# Patient Record
Sex: Female | Born: 1980 | Hispanic: Yes | Marital: Single
Health system: Southern US, Community
[De-identification: ages and names within clinical notes are randomized; demographics above are authoritative.]

---

## 2006-03-08 ENCOUNTER — Ambulatory Visit: Payer: Self-pay | Admitting: Family Medicine

## 2008-04-24 ENCOUNTER — Ambulatory Visit: Payer: Self-pay

## 2012-03-29 ENCOUNTER — Ambulatory Visit: Payer: Self-pay | Admitting: Family Medicine

## 2012-06-27 ENCOUNTER — Ambulatory Visit: Payer: Self-pay | Admitting: Family Medicine

## 2012-08-29 ENCOUNTER — Observation Stay: Payer: Self-pay | Admitting: Obstetrics and Gynecology

## 2012-08-29 LAB — RUPTURE OF MEMBRANE PLUS: Rom Plus: NOT DETECTED

## 2012-09-12 ENCOUNTER — Observation Stay: Payer: Self-pay | Admitting: Obstetrics and Gynecology

## 2012-09-12 LAB — URINALYSIS, COMPLETE
Glucose,UR: NEGATIVE mg/dL (ref 0–75)
Ketone: NEGATIVE
Ph: 8 (ref 4.5–8.0)
RBC,UR: 42 /HPF (ref 0–5)
Specific Gravity: 1.008 (ref 1.003–1.030)
Squamous Epithelial: 1

## 2012-11-15 ENCOUNTER — Inpatient Hospital Stay: Payer: Self-pay | Admitting: Obstetrics and Gynecology

## 2012-11-15 LAB — CBC WITH DIFFERENTIAL/PLATELET
Basophil #: 0 10*3/uL (ref 0.0–0.1)
Eosinophil #: 0 10*3/uL (ref 0.0–0.7)
Eosinophil %: 0.4 %
HGB: 10.2 g/dL — ABNORMAL LOW (ref 12.0–16.0)
Lymphocyte %: 23.6 %
MCH: 26.8 pg (ref 26.0–34.0)
MCV: 80 fL (ref 80–100)
Monocyte %: 7.4 %
Neutrophil #: 6.7 10*3/uL — ABNORMAL HIGH (ref 1.4–6.5)
Neutrophil %: 68.1 %
Platelet: 207 10*3/uL (ref 150–440)
RDW: 15.7 % — ABNORMAL HIGH (ref 11.5–14.5)
WBC: 9.8 10*3/uL (ref 3.6–11.0)

## 2012-11-16 LAB — HEMOGLOBIN: HGB: 8.1 g/dL — ABNORMAL LOW (ref 12.0–16.0)

## 2013-02-05 ENCOUNTER — Emergency Department: Payer: Self-pay | Admitting: Emergency Medicine

## 2013-02-05 LAB — URINALYSIS, COMPLETE
Blood: NEGATIVE
Glucose,UR: NEGATIVE mg/dL (ref 0–75)
Ketone: NEGATIVE
Ph: 6 (ref 4.5–8.0)
RBC,UR: 3 /HPF (ref 0–5)
Specific Gravity: 1.017 (ref 1.003–1.030)

## 2013-02-05 LAB — COMPREHENSIVE METABOLIC PANEL
Albumin: 4 g/dL (ref 3.4–5.0)
Alkaline Phosphatase: 70 U/L (ref 50–136)
BUN: 19 mg/dL — ABNORMAL HIGH (ref 7–18)
Calcium, Total: 9 mg/dL (ref 8.5–10.1)
Creatinine: 0.92 mg/dL (ref 0.60–1.30)
Osmolality: 278 (ref 275–301)
Potassium: 4 mmol/L (ref 3.5–5.1)
SGOT(AST): 42 U/L — ABNORMAL HIGH (ref 15–37)
SGPT (ALT): 79 U/L — ABNORMAL HIGH (ref 12–78)
Sodium: 138 mmol/L (ref 136–145)
Total Protein: 7.8 g/dL (ref 6.4–8.2)

## 2013-02-05 LAB — CBC
HGB: 11.4 g/dL — ABNORMAL LOW (ref 12.0–16.0)
MCHC: 33.3 g/dL (ref 32.0–36.0)
MCV: 82 fL (ref 80–100)
RBC: 4.17 10*6/uL (ref 3.80–5.20)

## 2013-02-05 LAB — PROTIME-INR: Prothrombin Time: 13.6 secs (ref 11.5–14.7)

## 2014-02-07 ENCOUNTER — Ambulatory Visit: Payer: Self-pay | Admitting: Physician Assistant

## 2014-06-19 ENCOUNTER — Inpatient Hospital Stay: Payer: Self-pay | Admitting: Obstetrics and Gynecology

## 2014-07-08 ENCOUNTER — Emergency Department: Payer: Self-pay | Admitting: Emergency Medicine

## 2014-07-20 LAB — URINE CULTURE

## 2014-08-18 NOTE — Op Note (Signed)
PATIENT NAME:  Terri Jimenez, [name]:  [id number] DATE OF BIRTH:  October 05, 1980  DATE OF PROCEDURE:  06/19/2014  PREOPERATIVE DIAGNOSES:  1.  Intrauterine pregnancy at term.  2.  Active labor.  3.  Group B streptococcus negative, Rh+.   POSTOPERATIVE DIAGNOSES:  1.  Intrauterine pregnancy at term.  2.  Active labor.  3.  Group B streptococcus negative, Rh+.  4.  Delivery of a viable female infant.   PROCEDURE: Normal spontaneous vaginal delivery.   ANESTHESIA: None.   SURGEON: Christeen DouglasBethany Lynise Porr, MD   ESTIMATED BLOOD LOSS: 150 mL.   COMPLICATIONS: None.   FINDINGS: Viable female infant with Apgars of 8 and 9 at one and five minutes respectively. Weight still unknown at the time of this dictation.   INDICATION FOR PROCEDURE: [name] is a 34 year old, gravida 5, para 4, 0-0-4 who presented at 39-5/7 weeks estimated gestational age in active labor. She progressed from 5 cm at presentation to 8 cm at which time she was artificially ruptured for clear fluid. Fetal tracing was category 1 throughout. She progressed to fully dilated and entered the second stage of labor, at which point she began to push over an intact perineum.   The patient pushed during 3 contractions very effectively and delivered a viable female infant in the ROA position over an intact perineum. There was no nuchal cord and the body followed delivery of the fetal head immediately. The baby began to cry vigorously on the perineum and was bulb suctioned. He was placed immediately on the maternal abdomen. He had a short umbilical cord which was doubly clamped and cut and a 3- vessel cord was noted. Fundal massage was performed and the placenta delivered spontaneously and was intact. Examination of the perineum and vagina as well as the cervix revealed no lacerations. Bleeding was normal. Firm fundal massage was continued and several small clots were expressed. Postpartum Pitocin was hung and the baby was placed  skin to skin. The patient tolerated this procedure well and is stable and recovering in the LDR.   ____________________________ Cline CoolsBethany E. Yuritza Paulhus, MD beb:mc D: 06/19/2014 05:57:47 ET T: 06/19/2014 10:19:11 ET JOB#: [id number]  cc: Cline CoolsBethany E. Jibril Mcminn, MD, <Dictator> Cline CoolsBETHANY E Yavonne Kiss MD ELECTRONICALLY SIGNED 06/28/2014 7:40

## 2014-10-22 IMAGING — US US OB < 14 WEEKS - US OB TV
1 series · 13 of 28 positions shown · non-contrast
Comparison: none

REASON FOR EXAM: left side abd and pelvic pain
COMMENTS:

PROCEDURE:     US  - US OB LESS THAN 14 WEEKS/W TRANS  - March 29, 2012  [DATE]
RESULT:

[Series 1: us ob < 14 weeks - us ob tv · 0.23mm/px · 13 of 41 slices shown]
[im 2/41]
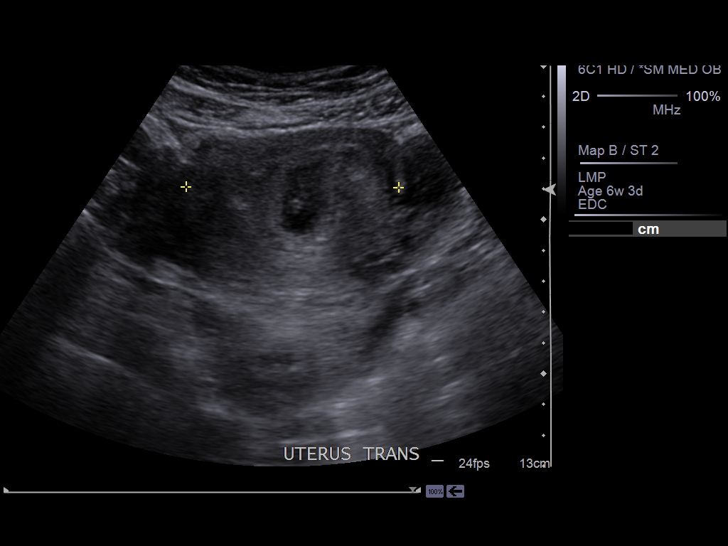
[im 5/41]
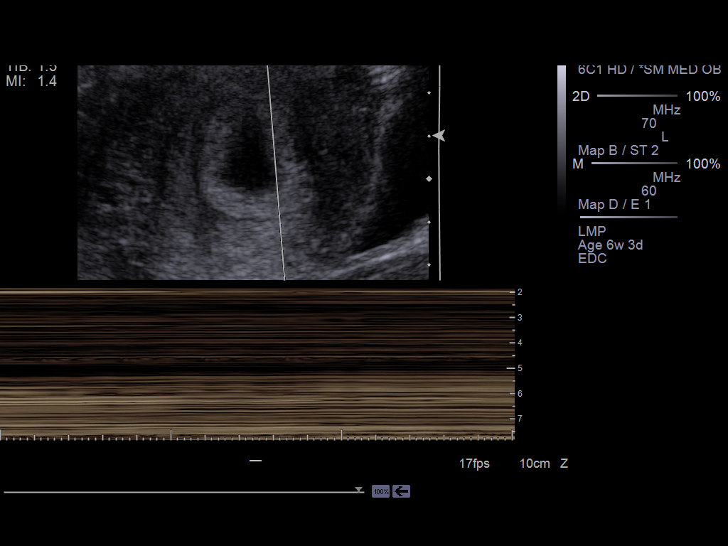
[im 8/41]
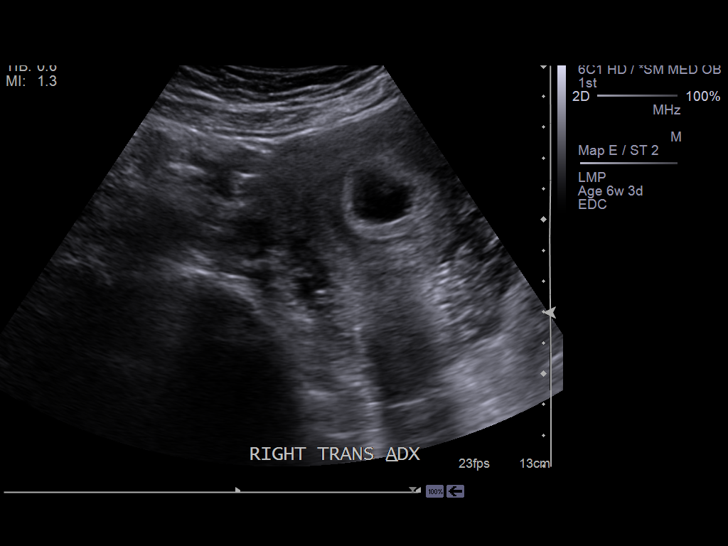
[im 11/41]
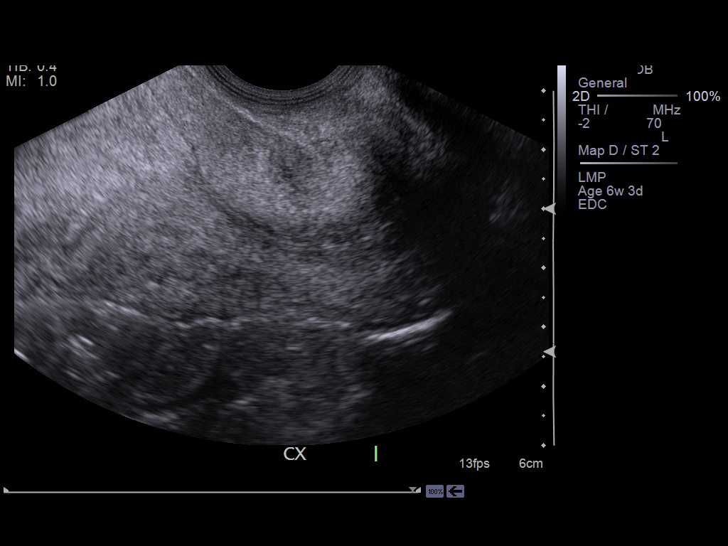
[im 14/41]
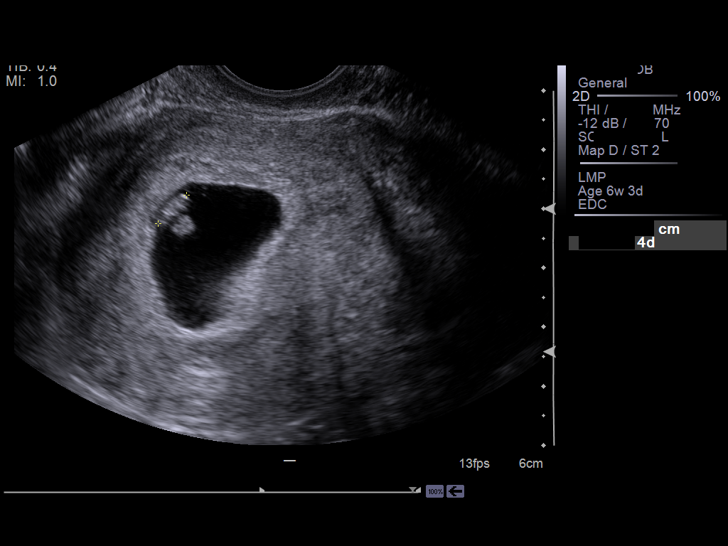
[im 17/41]
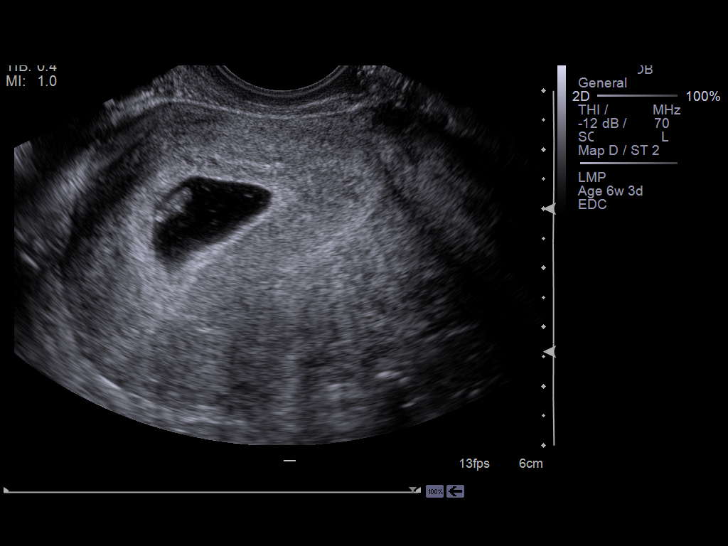
[im 21/41]
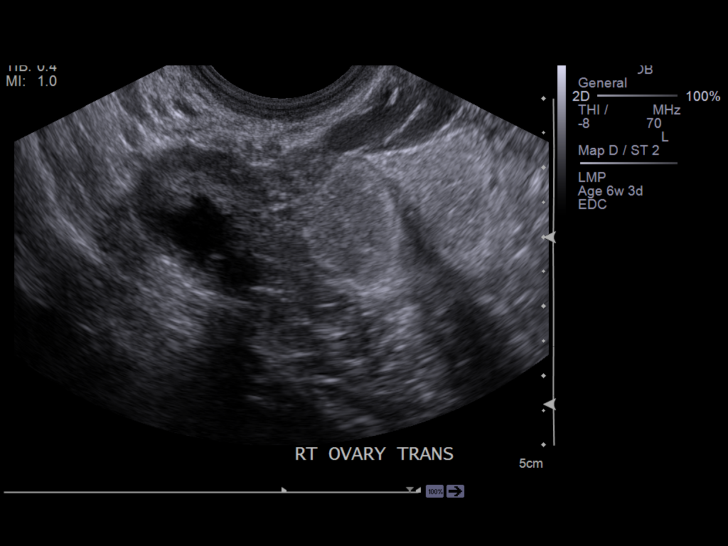
[im 24/41]
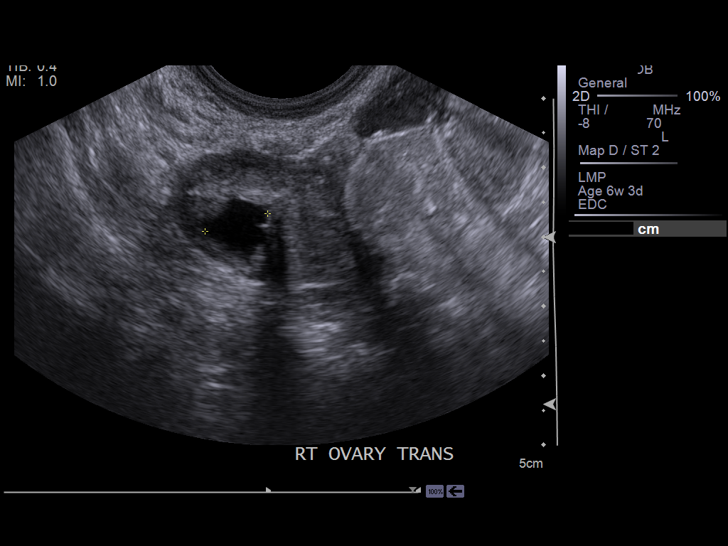
[im 27/41]
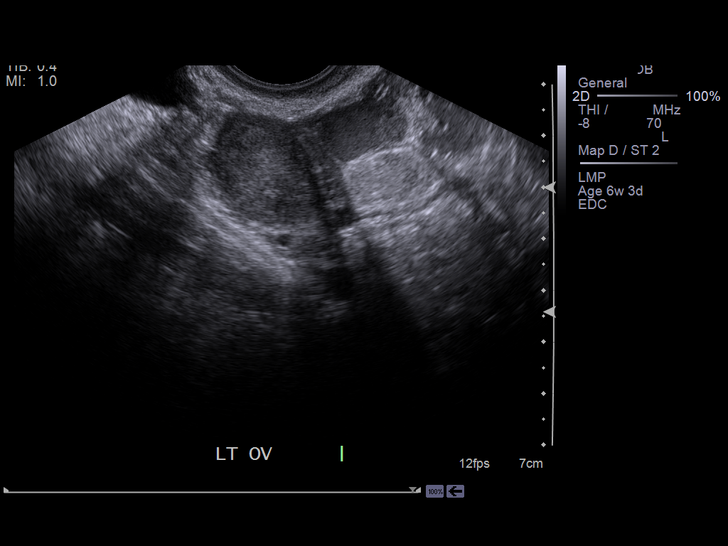
[im 30/41]
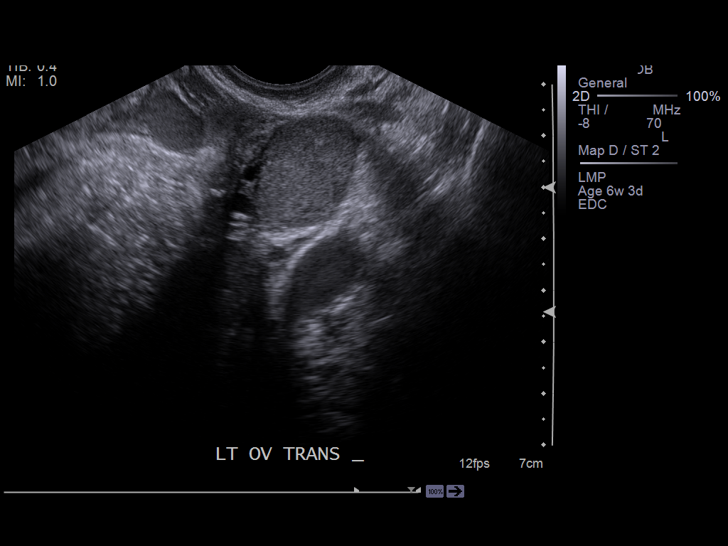
[im 33/41]
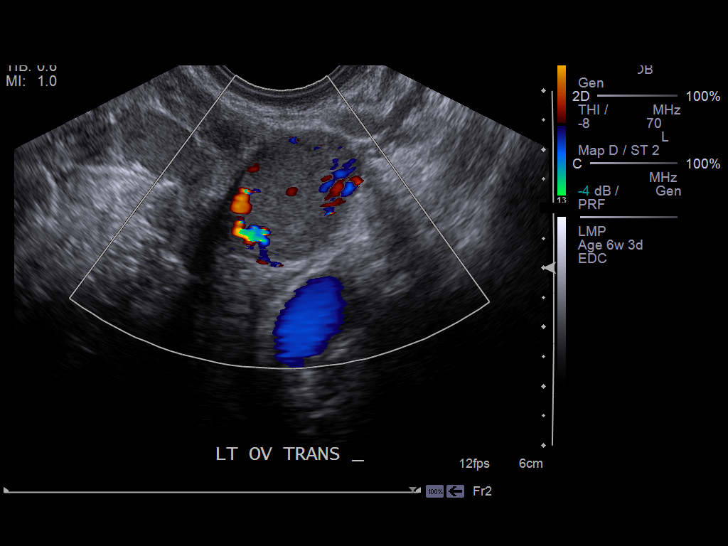
[im 36/41]
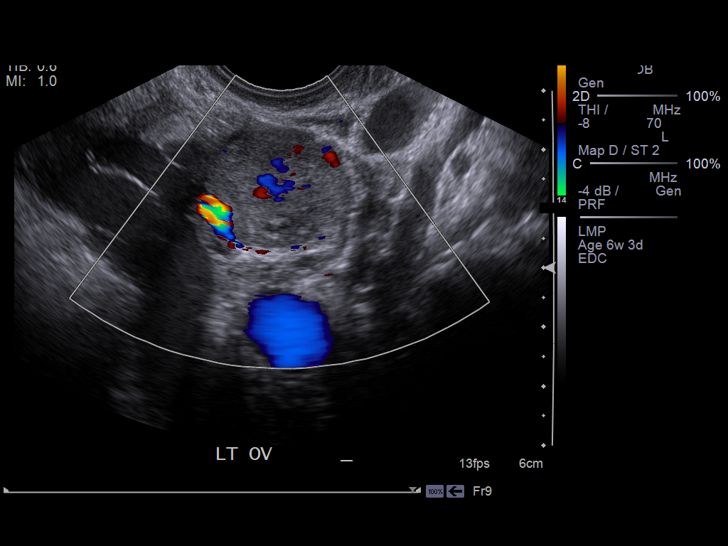
[im 39/41]
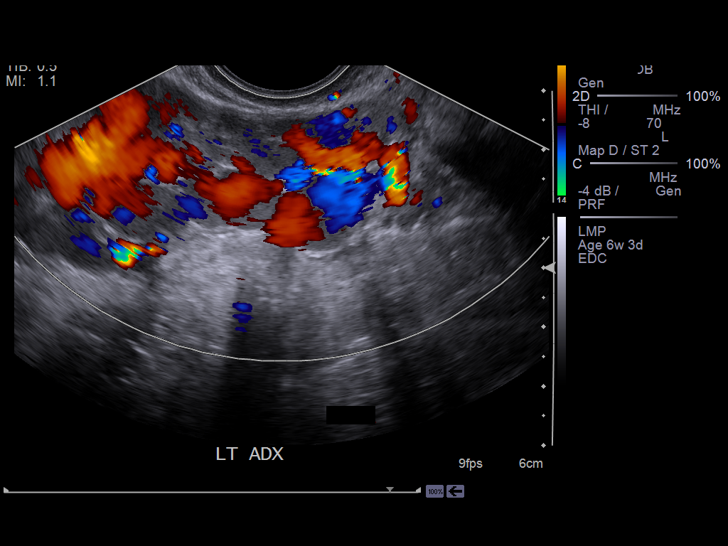

[13 of 28 positions shown; findings below may reference images not displayed]

FINDINGS: An intrauterine gestational sac is identified. A fetal pole and
yolk sac is appreciated. Crown-rump length measures 6.8 mm which corresponds
to an estimated gestational age of 6 weeks, 4 days. This correlates with the
patient's LMP of 02/13/2012. The uterus otherwise demonstrates a homogeneous
echotexture. Estimated fetal heart rate is 126 beats per minute. The right
ovary measures 3.5 x 2.8 cm and the left is 3.89 x 2.26 x 3.1 cm. A right
ovarian cyst is identified, septal, measuring 2.36 x 0.77 x 0.94 cm. The
left ovary demonstrates no evidence of cysts or follicles. Color filling of
vessels is identified within the right and left ovaries.

Prominent vessels with color filling are identified within the left adnexal
region.

There is no evidence of pelvic free fluid or loculated fluid collections.
IMPRESSION: 1.  Single, viable intrauterine pregnancy.
2.  Prominent vessels within the left adnexal region. These findings may
represent a component of pelvic congestion syndrome and clinical correlation
is recommended.
3.  Right ovarian cyst.

## 2015-01-20 IMAGING — US US OB US >=[ID] SNGL FETUS
1 series · 13 of 28 positions shown · non-contrast
Comparison: none

REASON FOR EXAM: anatomy
COMMENTS:

[Series 1: us ob us >=(id) sngl fetus · 0.26mm/px · 13 of 101 slices shown]
[im 4/101]
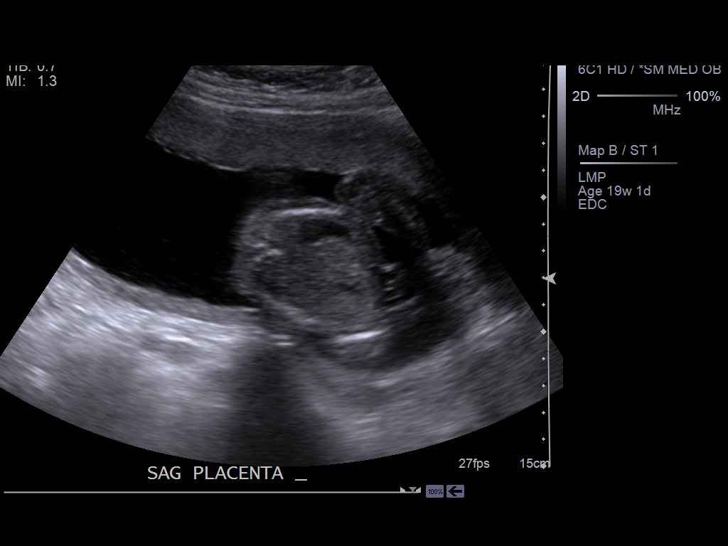
[im 12/101]
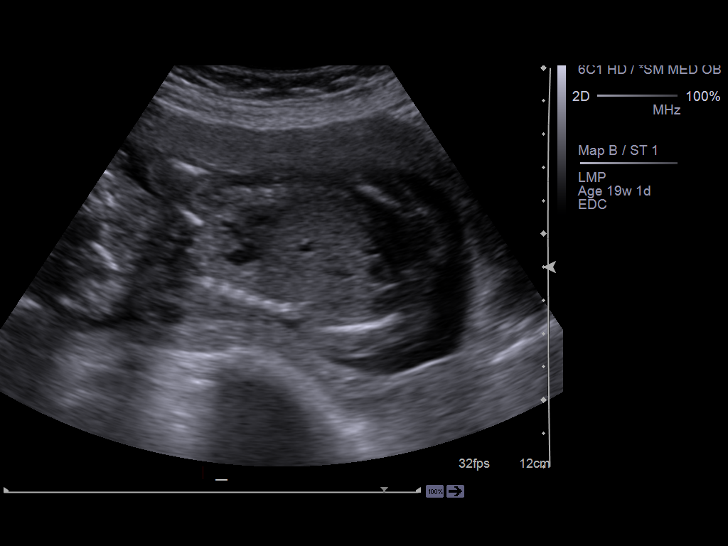
[im 19/101]
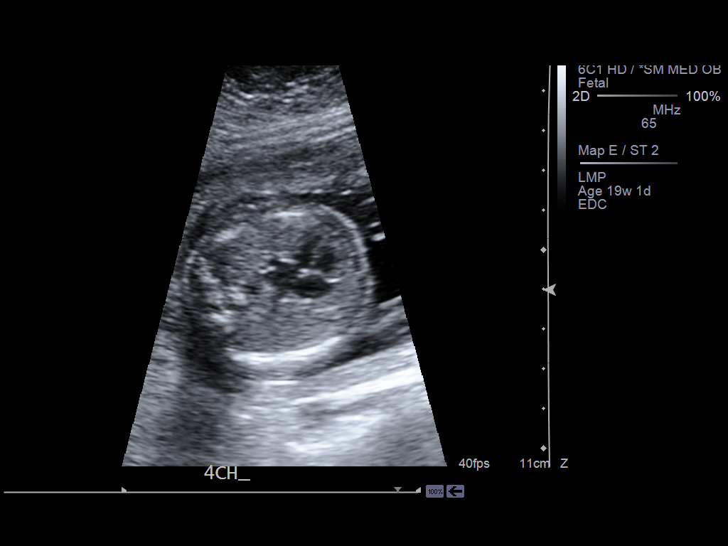
[im 26/101]
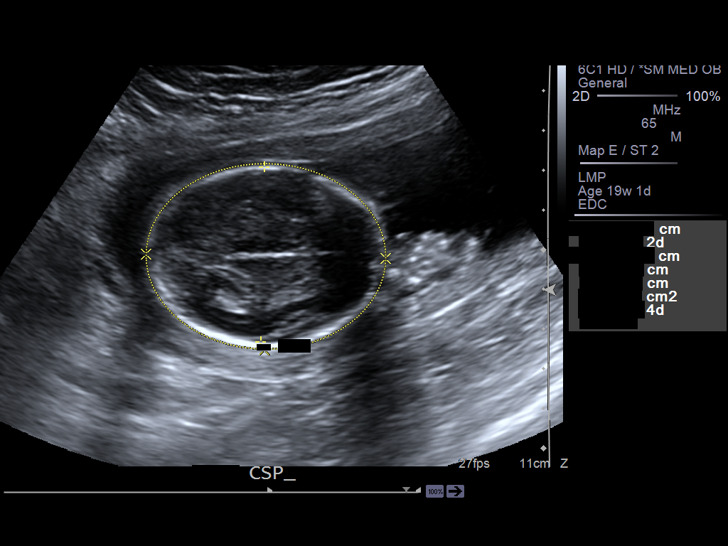
[im 34/101]
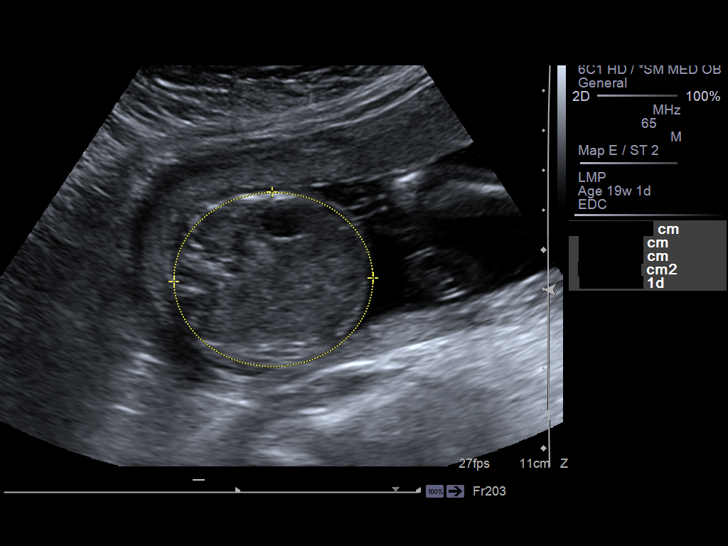
[im 41/101]
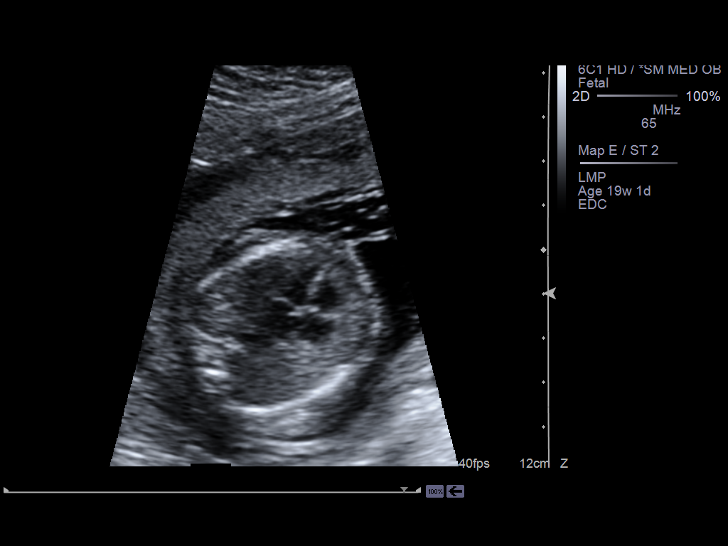
[im 52/101]
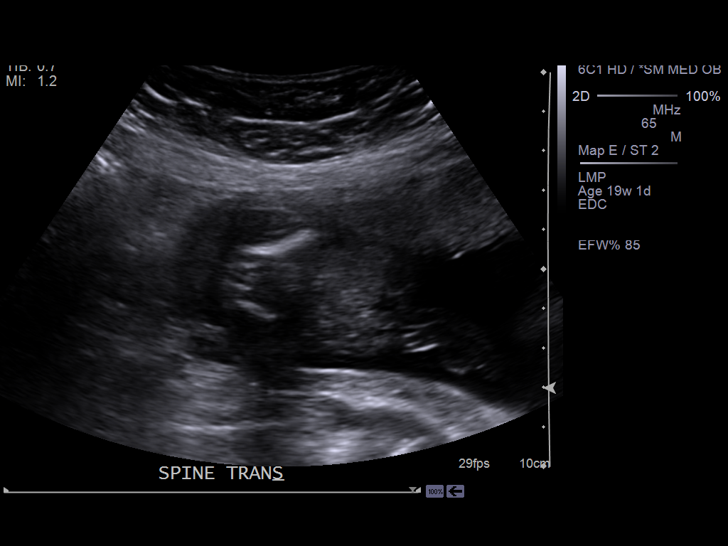
[im 60/101]
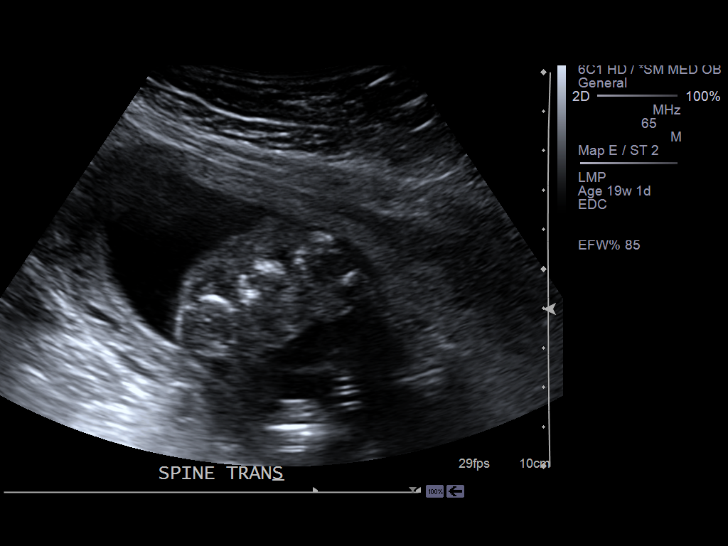
[im 67/101]
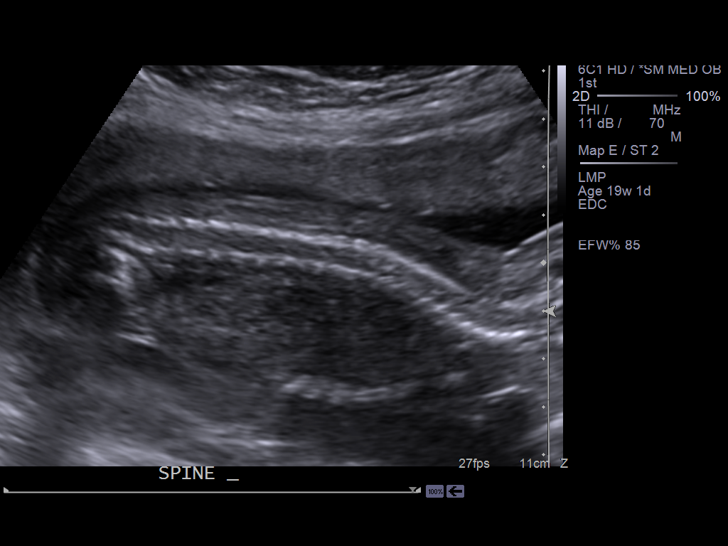
[im 75/101]
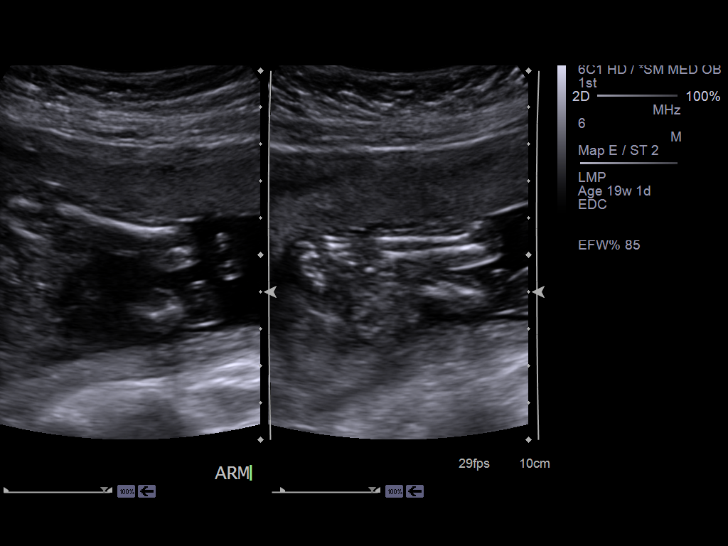
[im 82/101]
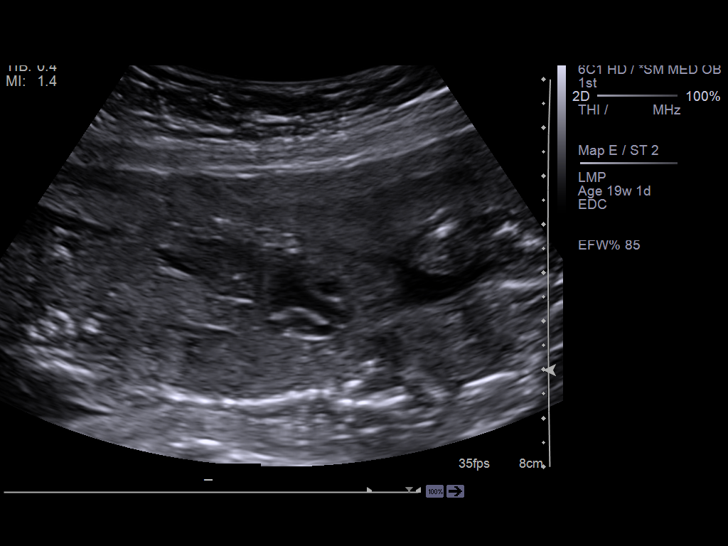
[im 89/101]
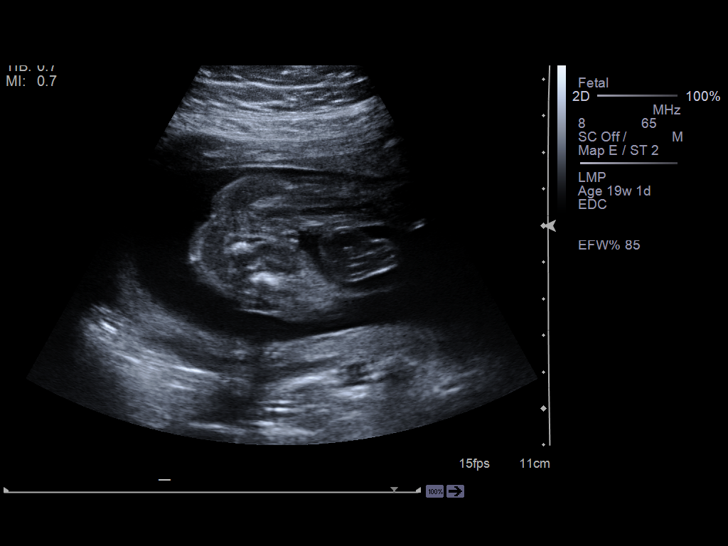
[im 97/101]
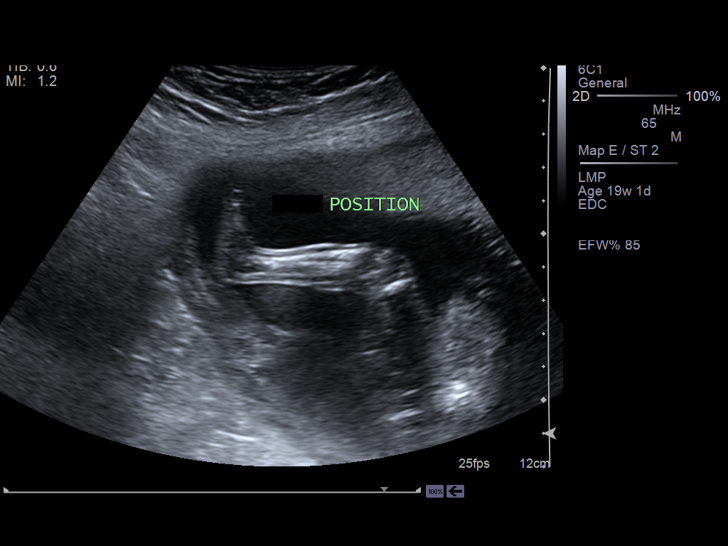

[13 of 28 positions shown; findings below may reference images not displayed]

PROCEDURE:     US  - US OB GREATER/OR EQUAL TO DQ3IA  - June 27, 2012  [DATE]

RESULT:     OB protocol pelvic sonogram performed transabdominally
demonstrates a single intrauterine fetus with a longitudinal lie in a breech
presentation demonstrating trunk, extremity and cardiac motion with a heart
rate measured at 158 beats per minute. The placenta is anterior in position.
The length of the cervix is 4.0 cm. The cervix is closed. The distance from
the internal cervical os to the lower margin of the anterior placenta is
5.64 cm. Three-vessel umbilical cord is demonstrated. Cortical insertion at
the abdomen and placenta appear to be normal. The fetal stomach, urinary
bladder, kidneys, left ventricular outflow tract, four-chamber view of
heart, diaphragm, visualized spine and intracranial ventricles appear
normal. Fetal position made visualization of the cervical spine and facial
structures difficult. The amniotic fluid volume is visually normal but AFI
was not measured. Based on the biophysical measurements obtained gestational
age is calculated at 19 weeks 6 days with an ultrasound EDC 15 November, 2012
and a current estimated fetal weight of 318 grams + / - 47 grams placing the
fetus at the 85th percentile of weight for the gestational estimate.
IMPRESSION: 1. Single intrauterine fetus as described with an estimated gestational age
of 19 weeks 6 days an ultrasound EDC 15 November, 2012 the no significant
abnormality evident.

[REDACTED]

## 2015-09-01 IMAGING — CT CT MAXILLOFACIAL WITHOUT CONTRAST
2 series · 10 of 14 positions shown, 12 images · non-contrast
Comparison: none

REASON FOR EXAM: fall w/ facial trauma
COMMENTS:

[Series 2: facial 3.0 h60f · axial · 0.34mm/px · z∈[-236,-164]mm · 3 of 49 slices shown]
[im 13/49  bone]
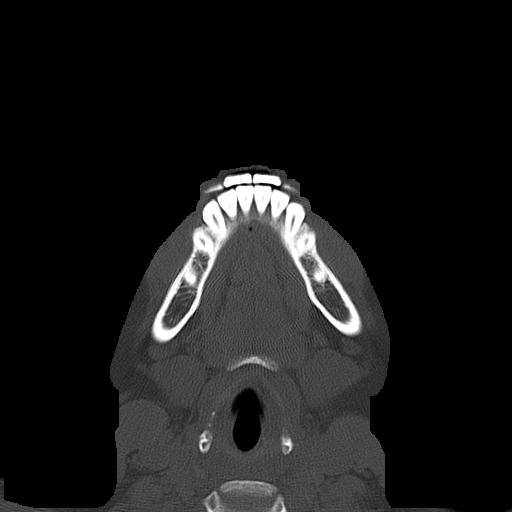
[im 25/49  bone]
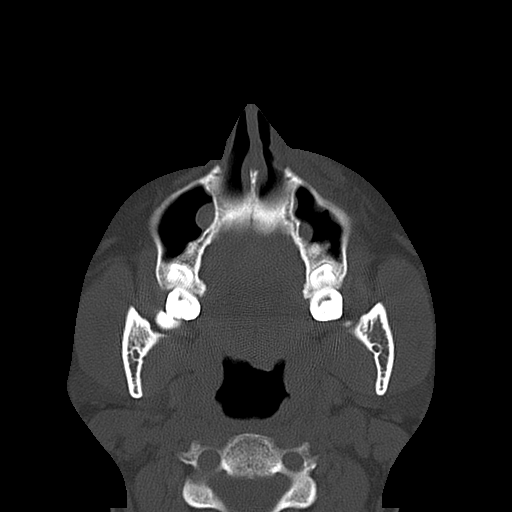
[im 37/49  bone]
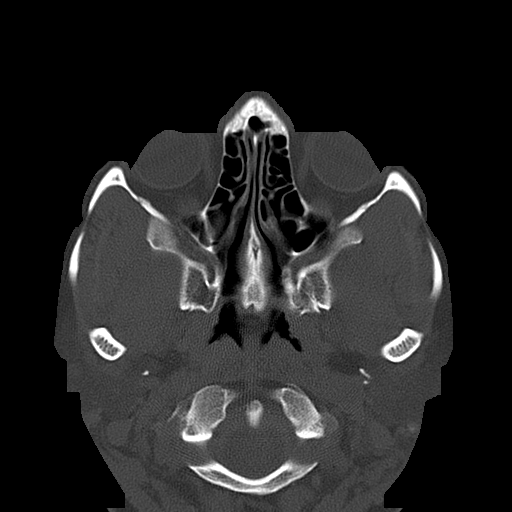

[Series 6: axial · axial · 0.33mm/px · z∈[-298,-185]mm · 7 of 82 slices shown, 9 images]
[im 11/82  soft-tissue]
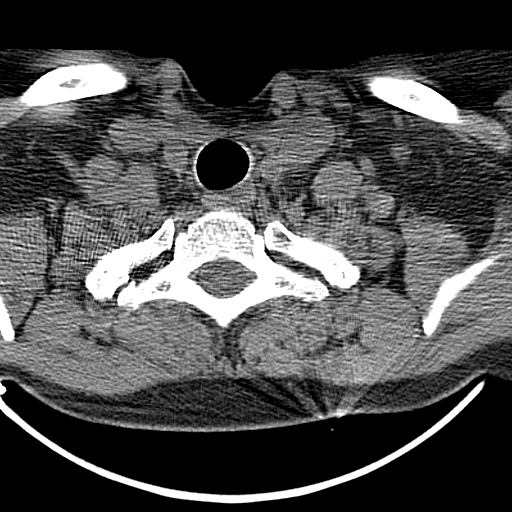
[im 11/82  bone]
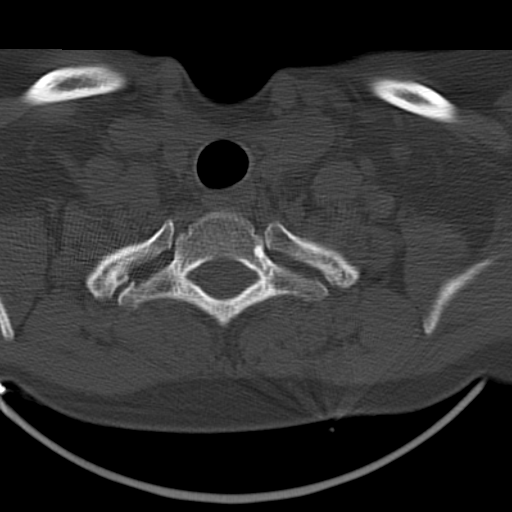
[im 21/82  bone]
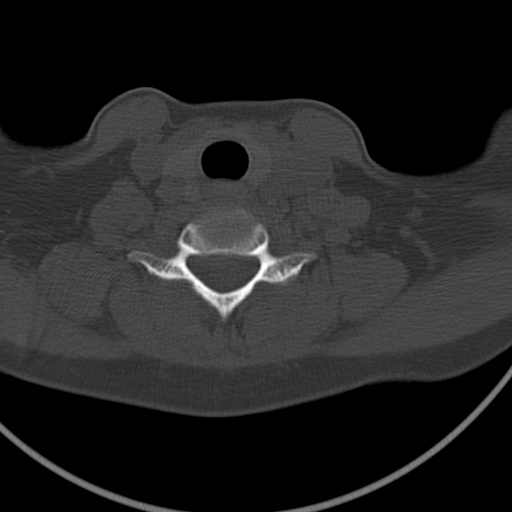
[im 31/82  bone]
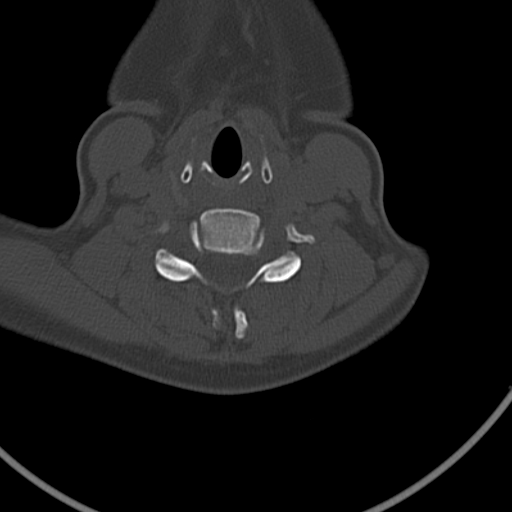
[im 41/82  bone]
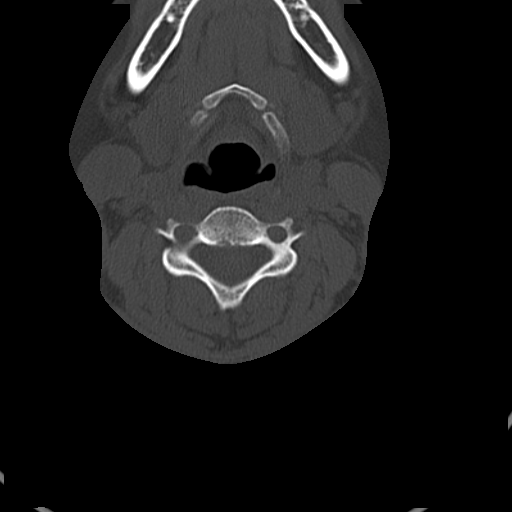
[im 51/82  soft-tissue]
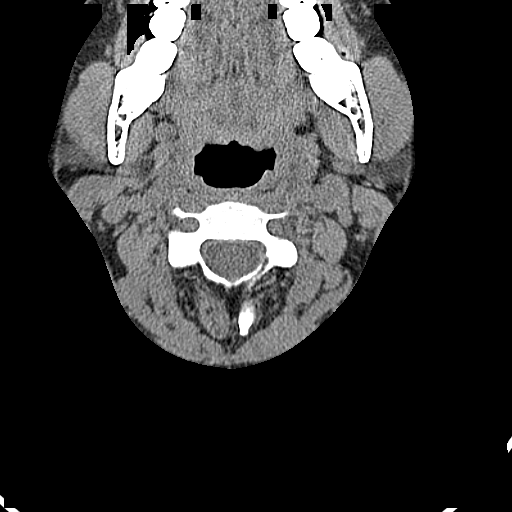
[im 51/82  bone]
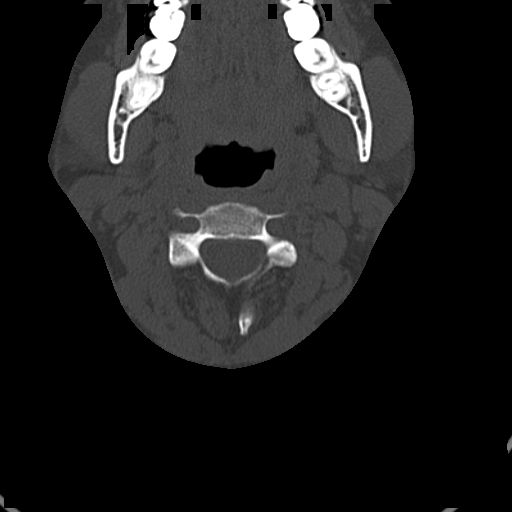
[im 61/82  bone]
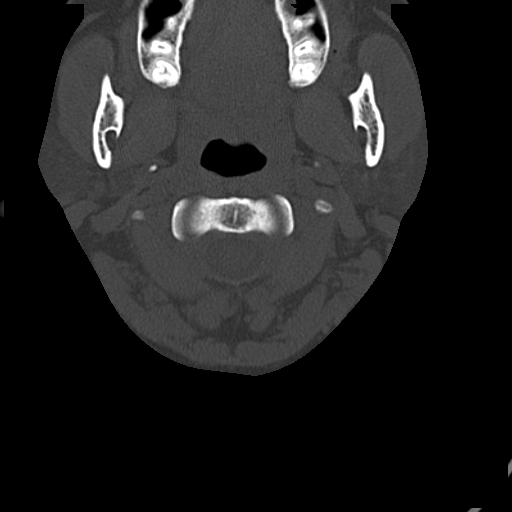
[im 71/82  bone]
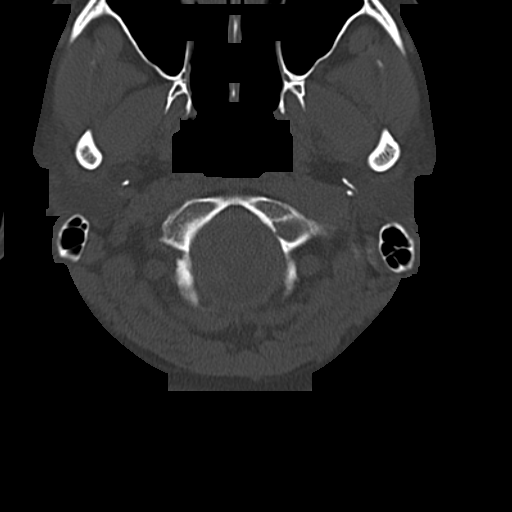

[10 of 14 positions shown; findings below may reference images not displayed]

PROCEDURE:     CT  - CT MAXILLOFACIAL AREA WO  - February 06, 2013  [DATE]

RESULT:     Axial CT scanning was performed through the facial bones with
reconstructions at 3 mm intervals and slice thicknesses. Review of coronal
reconstructed images was performed as well. A bone algorithm was employed.

There is some fragmentation of the nasal bone with a small amount of
overlying soft tissue swelling. This may reflect an acute fracture. There
are no air fluid levels in the paranasal sinuses. There is likely a
retention cyst or polyp in the posterior right ethmoid sinus cell. Smaller
soft tissue densities inferiorly in the maxillary sinuses may reflect
retention cysts or polyps. The sphenoid sinuses and mastoid air cells are
clear.

Zygomatic arches are intact. The bony orbits appear intact. There is no
orbital floor blowout fracture. The nasal septum is intact. The
temporomandibular joints and mandible appear normal.
IMPRESSION: 1. There is a comminuted fracture of the nasal bone without significant
displacement but mild depression is noted.
2. The globes and pre- and postseptal soft tissues appear normal. The bony
orbits appear intact.
3. Adjacent to the left aspect of the maxilla on image 18 there is an
approximately 3 mm diameter calcification that is of uncertain etiology. Is
there evidence of dental injury?
4. There is mild swelling of the left malar soft tissues without evidence of
an underlying fracture.

A preliminary report was sent to the [HOSPITAL] the conclusion
of the study.

[REDACTED]
